# Patient Record
Sex: Female | Born: 1955 | Race: Black or African American | Hispanic: No | Marital: Married | Smoking: Never smoker
Health system: Southern US, Community
[De-identification: ages and names within clinical notes are randomized; demographics above are authoritative.]

## PROBLEM LIST (undated history)

## (undated) DIAGNOSIS — I10 Essential (primary) hypertension: Secondary | ICD-10-CM

## (undated) DIAGNOSIS — M069 Rheumatoid arthritis, unspecified: Secondary | ICD-10-CM

## (undated) HISTORY — PX: ABDOMINAL HYSTERECTOMY: SHX81

---

## 2006-08-24 ENCOUNTER — Emergency Department (HOSPITAL_COMMUNITY): Admission: EM | Admit: 2006-08-24 | Discharge: 2006-08-24 | Payer: Self-pay | Admitting: Emergency Medicine

## 2011-01-18 ENCOUNTER — Telehealth: Payer: Self-pay | Admitting: Family Medicine

## 2011-01-18 NOTE — Telephone Encounter (Signed)
Closed

## 2015-03-12 ENCOUNTER — Emergency Department (HOSPITAL_COMMUNITY)
Admission: EM | Admit: 2015-03-12 | Discharge: 2015-03-12 | Disposition: A | Discharge status: Home / Self Care | Payer: BLUE CROSS/BLUE SHIELD | Attending: Emergency Medicine | Admitting: Emergency Medicine

## 2015-03-12 ENCOUNTER — Emergency Department (HOSPITAL_COMMUNITY): Payer: BLUE CROSS/BLUE SHIELD

## 2015-03-12 ENCOUNTER — Encounter (HOSPITAL_COMMUNITY): Payer: Self-pay

## 2015-03-12 DIAGNOSIS — I10 Essential (primary) hypertension: Secondary | ICD-10-CM | POA: Insufficient documentation

## 2015-03-12 DIAGNOSIS — M1990 Unspecified osteoarthritis, unspecified site: Secondary | ICD-10-CM | POA: Insufficient documentation

## 2015-03-12 DIAGNOSIS — R42 Dizziness and giddiness: Secondary | ICD-10-CM | POA: Diagnosis not present

## 2015-03-12 DIAGNOSIS — R110 Nausea: Secondary | ICD-10-CM | POA: Insufficient documentation

## 2015-03-12 DIAGNOSIS — M25552 Pain in left hip: Secondary | ICD-10-CM | POA: Insufficient documentation

## 2015-03-12 DIAGNOSIS — Z79899 Other long term (current) drug therapy: Secondary | ICD-10-CM | POA: Insufficient documentation

## 2015-03-12 DIAGNOSIS — R109 Unspecified abdominal pain: Secondary | ICD-10-CM | POA: Insufficient documentation

## 2015-03-12 HISTORY — DX: Essential (primary) hypertension: I10

## 2015-03-12 LAB — URINALYSIS, ROUTINE W REFLEX MICROSCOPIC
Bilirubin Urine: NEGATIVE
GLUCOSE, UA: NEGATIVE mg/dL
KETONES UR: NEGATIVE mg/dL
LEUKOCYTES UA: NEGATIVE
Nitrite: NEGATIVE
PROTEIN: NEGATIVE mg/dL
Specific Gravity, Urine: 1.025 (ref 1.005–1.030)
pH: 6 (ref 5.0–8.0)

## 2015-03-12 LAB — URINE MICROSCOPIC-ADD ON: WBC, UA: NONE SEEN WBC/hpf (ref 0–5)

## 2015-03-12 MED ORDER — ETODOLAC ER 400 MG PO TB24: 400.0000 mg | ORAL_TABLET | Freq: Every day | ORAL | Status: DC

## 2015-03-12 MED ORDER — DIAZEPAM 5 MG PO TABS: 5.0000 mg | ORAL_TABLET | Freq: Three times a day (TID) | ORAL | Status: DC | PRN

## 2015-03-12 MED ORDER — DIAZEPAM 5 MG PO TABS
5.0000 mg | ORAL_TABLET | Freq: Once | ORAL | Status: AC
  Administered 2015-03-12: 5 mg via ORAL
  Filled 2015-03-12: qty 1

## 2015-03-12 MED ORDER — OXYCODONE-ACETAMINOPHEN 5-325 MG PO TABS: 1.0000 | ORAL_TABLET | Freq: Four times a day (QID) | ORAL | Status: DC | PRN

## 2015-03-12 MED ORDER — KETOROLAC TROMETHAMINE 60 MG/2ML IM SOLN
30.0000 mg | Freq: Once | INTRAMUSCULAR | Status: AC
  Administered 2015-03-12: 30 mg via INTRAMUSCULAR
  Filled 2015-03-12: qty 2

## 2015-03-12 NOTE — ED Provider Notes (Signed)
CSN: [id number]     Arrival date & time 03/12/15  2032 History  By signing my name below, I, Gonzella Lex, attest that this documentation has been prepared under the direction and in the presence of Marily Memos, MD. Electronically Signed: Gonzella Lex, Scribe. 03/12/2015. 9:15 PM.    Chief Complaint  Patient presents with  . Flank Pain  . Hip Pain     The history is provided by the patient. No language interpreter was used.    HPI Comments: Mary Delacruz is a 59 y.o. female with a hx of HTN and otsteoarthritis who presents to the Emergency Department complaining of gradually worsening left hip pain that radiates down her left side onset yesterday. Pt describes the pain as a tightening and grabbing pain that is worse with movement. She also notes nausea and dizziness. Pt reports that she has never experienced similar hip pain before. She reports that nothing makes it Delacruz. She denies numbness and weakness in her legs, bowel incontinence, recent fever, vomiting, diarrhea, rash and constipation. She also denies any recent falls and reports that she has never been diagnosed with sciatica. Pt is currently on prednisone but denies frequent use of it.   Past Medical History  Diagnosis Date  . Hypertension    Past Surgical History  Procedure Laterality Date  . Abdominal hysterectomy     No family history on file. Social History  Substance Use Topics  . Smoking status: Never Smoker   . Smokeless tobacco: None  . Alcohol Use: No   OB History    No data available     Review of Systems  Constitutional: Negative for fever.  Gastrointestinal: Positive for nausea. Negative for vomiting, diarrhea and constipation.  Genitourinary: Positive for flank pain.  Musculoskeletal: Positive for myalgias and arthralgias.  Skin: Negative for rash.  Neurological: Positive for dizziness. Negative for weakness and numbness.  All other systems reviewed and are  negative.   Allergies  Sulfa antibiotics  Home Medications   Prior to Admission medications   Medication Sig Start Date End Date Taking? Authorizing Provider  amLODipine (NORVASC) 5 MG tablet Take 5 mg by mouth daily. 02/23/15  Yes Historical Provider, MD  cholecalciferol (VITAMIN D) 1000 UNITS tablet Take 1,000 Units by mouth daily.   Yes Historical Provider, MD  Omega-3 Fatty Acids (FISH OIL) 1200 MG CAPS Take 1 capsule by mouth daily.   Yes Historical Provider, MD  predniSONE (STERAPRED UNI-PAK 48 TAB) 10 MG (48) TBPK tablet See admin instructions. Take as directed per package instructions starting on 03/02/2015 for 12 days 03/02/15  Yes Historical Provider, MD  vitamin C (ASCORBIC ACID) 500 MG tablet Take 500 mg by mouth daily.   Yes Historical Provider, MD  diazepam (VALIUM) 5 MG tablet Take 1 tablet (5 mg total) by mouth every 8 (eight) hours as needed for muscle spasms. 03/12/15   Marily Memos, MD  etodolac (LODINE XL) 400 MG 24 hr tablet Take 1 tablet (400 mg total) by mouth daily. 03/12/15   Marily Memos, MD  oxyCODONE-acetaminophen (PERCOCET/ROXICET) 5-325 MG tablet Take 1 tablet by mouth every 6 (six) hours as needed for severe pain. 03/12/15   Marily Memos, MD   BP 110/64 mmHg  Pulse 88  Temp(Src) 98.1 F (36.7 C) (Oral)  Resp 14  Ht  (1.651 m)  Wt 194 lb (87.998 kg)  BMI 32.28 kg/m2  SpO2 100% Physical Exam  Constitutional: She is oriented to person, place, and time. She  appears well-developed and well-nourished. No distress.  HENT:  Head: Normocephalic.  Eyes: Conjunctivae are normal.  Neck:  No cva tenderness No midline back tenderness   Cardiovascular: Normal rate, regular rhythm and normal heart sounds.   Pulmonary/Chest: Effort normal and breath sounds normal. No respiratory distress. She has no wheezes. She has no rales.  Abdominal: Soft. Bowel sounds are normal. She exhibits no distension. There is no tenderness.  Musculoskeletal:  Tenderness over her  greater trochanter of left hip No hip pain with ROM No rashes there  Normal sensation Normal strength in lower extremities  Neurological: She is alert and oriented to person, place, and time.  Skin: Skin is warm and dry.  Psychiatric: She has a normal mood and affect.  Nursing note and vitals reviewed.   ED Course  Procedures  DIAGNOSTIC STUDIES:    Oxygen Saturation is 96% on RA, adequate by my interpretation.   COORDINATION OF CARE:  9:07 PM Will order x-ray. Will prescribe pt pain medication and muscle relaxer. Discussed treatment plan with pt at bedside and pt agreed to plan.    Imaging Review Dg Hip Unilat With Pelvis 2-3 Views Left  03/12/2015  CLINICAL DATA:  Acute onset of left hip pain.  Initial encounter. EXAM: DG HIP (WITH OR WITHOUT PELVIS) 2-3V LEFT COMPARISON:  None. FINDINGS: There is no evidence of fracture or dislocation. Both femoral heads are seated normally within their respective acetabula. The proximal left femur appears intact. Mild degenerative change is noted at the lower lumbar spine, and mild sclerotic change is seen at the sacroiliac joints. The visualized bowel gas pattern is grossly unremarkable in appearance. Bilateral tubal ligation clips are noted. IMPRESSION: No evidence of fracture or dislocation. Electronically Signed   By: Roanna RaiderJeffery  Chang M.D.   On: 03/12/2015 22:38   I have personally reviewed and evaluated these images as part of my medical decision-making.   MDM   Final diagnoses:  Hip pain, left    Likely MSK pain, less likely bursitis. On steroids, elderly so xr done to eval for fracture, AVN, neoplasm but was negative. Symptoms somewhat improved with meds in ED. Will dc on muscle relaxers and anti inflammatories and PCP FU in 2-3 days if not improving. No swelling/erythema, calf ttp to suggest DVT. No joint pain with ROM to suggest septic arthritis.  I personally performed the services described in this documentation, which was scribed  in my presence. The recorded information has been reviewed and is accurate.       Marily MemosJason Sylvester Salonga, MD 03/13/15 1346

## 2015-03-12 NOTE — ED Notes (Signed)
Left side pain and pain in left hip. Started last night and got worse today per pt. Denies pain radiating any where else.  Increases with movement. Denies injury or recent falls. I had some dizziness, sweating, and nausea today.

## 2015-11-23 ENCOUNTER — Encounter (HOSPITAL_COMMUNITY): Payer: Self-pay

## 2015-11-23 DIAGNOSIS — M25511 Pain in right shoulder: Secondary | ICD-10-CM | POA: Diagnosis present

## 2015-11-23 DIAGNOSIS — I10 Essential (primary) hypertension: Secondary | ICD-10-CM | POA: Diagnosis not present

## 2015-11-23 DIAGNOSIS — Z79899 Other long term (current) drug therapy: Secondary | ICD-10-CM | POA: Insufficient documentation

## 2015-11-23 NOTE — ED Triage Notes (Signed)
Pt reports pain to her right shoulder that started tonight, states is very painful with movement and is unable to raise her arm without extreme pain

## 2015-11-24 ENCOUNTER — Emergency Department (HOSPITAL_COMMUNITY)
Admission: EM | Admit: 2015-11-24 | Discharge: 2015-11-24 | Disposition: A | Discharge status: Home / Self Care | Payer: BLUE CROSS/BLUE SHIELD | Attending: Emergency Medicine | Admitting: Emergency Medicine

## 2015-11-24 ENCOUNTER — Emergency Department (HOSPITAL_COMMUNITY): Payer: BLUE CROSS/BLUE SHIELD

## 2015-11-24 DIAGNOSIS — M25511 Pain in right shoulder: Secondary | ICD-10-CM

## 2015-11-24 MED ORDER — IBUPROFEN 800 MG PO TABS: 800.0000 mg | ORAL_TABLET | Freq: Three times a day (TID) | ORAL | 0 refills | Status: DC | PRN

## 2015-11-24 MED ORDER — OXYCODONE-ACETAMINOPHEN 5-325 MG PO TABS
2.0000 | ORAL_TABLET | Freq: Once | ORAL | Status: AC
Start: 2015-11-24 — End: 2015-11-24
  Administered 2015-11-24: 2 via ORAL
  Filled 2015-11-24: qty 2

## 2015-11-24 MED ORDER — OXYCODONE-ACETAMINOPHEN 5-325 MG PO TABS: 1.0000 | ORAL_TABLET | Freq: Four times a day (QID) | ORAL | 0 refills | Status: DC | PRN

## 2015-11-24 MED ORDER — ONDANSETRON 4 MG PO TBDP
4.0000 mg | ORAL_TABLET | Freq: Once | ORAL | Status: AC
  Administered 2015-11-24: 4 mg via ORAL
  Filled 2015-11-24: qty 1

## 2015-11-24 MED ORDER — IBUPROFEN 800 MG PO TABS
800.0000 mg | ORAL_TABLET | Freq: Once | ORAL | Status: AC
  Administered 2015-11-24: 800 mg via ORAL
  Filled 2015-11-24: qty 1

## 2015-11-24 NOTE — ED Provider Notes (Signed)
TIME SEEN: 2:20 AM  CHIEF COMPLAINT: Right shoulder pain  HPI: Pt is a 60 y.o. right-hand-dominant female with history of hypertension who presents emergency department right shoulder pain that started yesterday. No known injury to the arm. Pain is worse with trying to lift her arm above her head. No history of problems with her shoulder in the past. No fevers. No chest pain or shortness of breath. No numbness or focal weakness. No neck or back pain. 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 Does not regularly lift heavy things.  ROS: See HPI Constitutional: no fever  Eyes: no drainage  ENT: no runny nose   Cardiovascular:  no chest pain  Resp: no SOB  GI: no vomiting GU: no dysuria Integumentary: no rash  Allergy: no hives  Musculoskeletal: no leg swelling  Neurological: no slurred speech ROS otherwise negative  PAST MEDICAL HISTORY/PAST SURGICAL HISTORY:  Past Medical History:  Diagnosis Date  . Hypertension     MEDICATIONS:  Prior to Admission medications   Medication Sig Start Date End Date Taking? Authorizing Provider  amLODipine (NORVASC) 5 MG tablet Take 5 mg by mouth daily. 02/23/15  Yes Historical Provider, MD  cholecalciferol (VITAMIN D) 1000 UNITS tablet Take 1,000 Units by mouth daily.   Yes Historical Provider, MD  Omega-3 Fatty Acids (FISH OIL) 1200 MG CAPS Take 1 capsule by mouth daily.   Yes Historical Provider, MD  diazepam (VALIUM) 5 MG tablet Take 1 tablet (5 mg total) by mouth every 8 (eight) hours as needed for muscle spasms. 03/12/15   Marily Memos, MD  etodolac (LODINE XL) 400 MG 24 hr tablet Take 1 tablet (400 mg total) by mouth daily. 03/12/15   Marily Memos, MD  oxyCODONE-acetaminophen (PERCOCET/ROXICET) 5-325 MG tablet Take 1 tablet by mouth every 6 (six) hours as needed for severe pain. 03/12/15   Marily Memos, MD  predniSONE (STERAPRED UNI-PAK 48 TAB) 10 MG (48) TBPK tablet See admin instructions. Take as directed per package instructions starting on 03/02/2015  for 12 days 03/02/15   Historical Provider, MD  vitamin C (ASCORBIC ACID) 500 MG tablet Take 500 mg by mouth daily.    Historical Provider, MD    ALLERGIES:  Allergies  Allergen Reactions  . Sulfa Antibiotics Hives and Swelling    SOCIAL HISTORY:  Social History  Substance Use Topics  . Smoking status: Never Smoker  . Smokeless tobacco: Never Used  . Alcohol use No    FAMILY HISTORY: No family history on file.  EXAM: BP 142/79 (BP Location: Left Arm)   Pulse 71   Temp 98.9 F (37.2 C) (Oral)   Resp 18   Ht 5\' 5"  (1.651 m)   Wt 192 lb (87.1 kg)   SpO2 100%   BMI 31.95 kg/m  CONSTITUTIONAL: Alert and oriented and responds appropriately to questions. Well-appearing; well-nourished HEAD: Normocephalic EYES: Conjunctivae clear, PERRL ENT: normal nose; no rhinorrhea; moist mucous membranes NECK: Supple, no meningismus, no LAD  CARD: RRR; S1 and S2 appreciated; no murmurs, no clicks, no rubs, no gallops RESP: Normal chest excursion without splinting or tachypnea; breath sounds clear and equal bilaterally; no wheezes, no rhonchi, no rales, no hypoxia or respiratory distress, speaking full sentences ABD/GI: Normal bowel sounds; non-distended; soft, non-tender, no rebound, no guarding, no peritoneal signs BACK:  The back appears normal and is non-tender to palpation, there is no CVA tenderness EXT: Tender to palpation over the right shoulder diffusely without loss of fullness or bony deformity. There is no erythema  or warmth at this joint. No swelling or tenderness over the bursa. Full range of motion in the right elbow, wrist, fingers with no tenderness over these areas. Decreased ability to flex her shoulder on the right side past 30 secondary to pain. Unable to test muscle groups of the rotator cuff due to patient's pain. 2+ radial pulses bilaterally. Sensation to light touch intact diffusely. Normal grip strength bilaterally. Otherwise Normal ROM in all joints; otherwise extremity  is are non-tender to palpation; no edema; normal capillary refill; no cyanosis, no calf tenderness or swelling    SKIN: Normal color for age and race; warm; no rash NEURO: Moves all extremities equally, sensation to light touch intact diffusely, cranial nerves II through XII intact PSYCH: The patient's mood and manner are appropriate. Grooming and personal hygiene are appropriate.  MEDICAL DECISION MAKING: Patient here with right shoulder pain. Has degenerative changes on x-ray but no fracture or dislocation. Nothing on exam to suggest gout or septic arthritis. No chest pain or shortness of breath. Doubt this is her anginal equivalent. Pain reproduced with palpation and movement of the arm. Suspect tendinitis, possible rotator cuff injury. Have her committed elevation, ice, anti-inflammatories. We'll place her in a sling for comfort but have advised her to take her arm out of her sling regularly to avoid developing adhesive capsulitis. Discussed with her that if medical management does not improve her symptoms she may need follow-up with orthopedic physician for possible further imaging such as an MRI, steroid injections in the joint. Discussed return precautions. She is comfortable with this plan. We'll provide work note.  At this time, I do not feel there is any life-threatening condition present. I have reviewed and discussed all results (EKG, imaging, lab, urine as appropriate), exam findings with patient/family. I have reviewed nursing notes and appropriate previous records.  I feel the patient is safe to be discharged home without further emergent workup and can continue workup as an outpatient. Discussed usual and customary return precautions. Patient/family verbalize understanding and are comfortable with this plan.  Outpatient follow-up has been provided. All questions have been answered.        Layla MawKristen N Ward, DO 11/24/15 [id number]

## 2015-11-24 NOTE — Discharge Instructions (Signed)
You may use her sling for comfort. Please take your arm out of the sling regularly and move your shoulder to avoid developing adhesive capsulitis also known as frozen shoulder. If your pain does not improve with rest, elevation, ice, anti-inflammatories, please follow-up with orthopedics for further evaluation. Your x-ray showed no fracture or dislocation but did show arthritic changes, degenerative changes.

## 2018-12-01 ENCOUNTER — Encounter: Payer: Self-pay | Admitting: *Deleted

## 2019-04-20 ENCOUNTER — Telehealth: Payer: Self-pay | Admitting: Family Medicine

## 2019-04-20 NOTE — Telephone Encounter (Signed)
error 

## 2020-08-21 ENCOUNTER — Emergency Department (HOSPITAL_COMMUNITY): Payer: BC Managed Care – PPO

## 2020-08-21 ENCOUNTER — Emergency Department (HOSPITAL_COMMUNITY)
Admission: EM | Admit: 2020-08-21 | Discharge: 2020-08-21 | Disposition: A | Discharge status: Home / Self Care | Payer: BC Managed Care – PPO | Attending: Physician Assistant | Admitting: Physician Assistant

## 2020-08-21 ENCOUNTER — Other Ambulatory Visit: Payer: Self-pay

## 2020-08-21 ENCOUNTER — Encounter (HOSPITAL_COMMUNITY): Payer: Self-pay | Admitting: Emergency Medicine

## 2020-08-21 DIAGNOSIS — I10 Essential (primary) hypertension: Secondary | ICD-10-CM | POA: Insufficient documentation

## 2020-08-21 DIAGNOSIS — R079 Chest pain, unspecified: Secondary | ICD-10-CM

## 2020-08-21 HISTORY — DX: Rheumatoid arthritis, unspecified: M06.9

## 2020-08-21 LAB — COMPREHENSIVE METABOLIC PANEL
ALT: 21 U/L (ref 0–44)
AST: 18 U/L (ref 15–41)
Albumin: 4.2 g/dL (ref 3.5–5.0)
Alkaline Phosphatase: 59 U/L (ref 38–126)
Anion gap: 7 (ref 5–15)
BUN: 19 mg/dL (ref 8–23)
CO2: 27 mmol/L (ref 22–32)
Calcium: 9.2 mg/dL (ref 8.9–10.3)
Chloride: 107 mmol/L (ref 98–111)
Creatinine, Ser: 0.79 mg/dL (ref 0.44–1.00)
GFR, Estimated: >60 mL/min (ref >60)
Glucose, Bld: 103 mg/dL — ABNORMAL HIGH (ref 70–99)
Potassium: 3.9 mmol/L (ref 3.5–5.1)
Sodium: 141 mmol/L (ref 135–145)
Total Bilirubin: 0.8 mg/dL (ref 0.3–1.2)
Total Protein: 7.1 g/dL (ref 6.5–8.1)

## 2020-08-21 LAB — TROPONIN I (HIGH SENSITIVITY)
Troponin I (High Sensitivity): 3 ng/L (ref <18)
Troponin I (High Sensitivity): 2 ng/L (ref <18)

## 2020-08-21 LAB — CBC
HCT: 40.3 % (ref 36.0–46.0)
Hemoglobin: 12.8 g/dL (ref 12.0–15.0)
MCH: 30.2 pg (ref 26.0–34.0)
MCHC: 31.8 g/dL (ref 30.0–36.0)
MCV: 95 fL (ref 80.0–100.0)
Platelets: 242 10*3/uL (ref 150–400)
RBC: 4.24 MIL/uL (ref 3.87–5.11)
RDW: 13.7 % (ref 11.5–15.5)
WBC: 3.3 10*3/uL — ABNORMAL LOW (ref 4.0–10.5)
nRBC: 0 % (ref 0.0–0.2)

## 2020-08-21 MED ORDER — ASPIRIN 81 MG PO CHEW
324.0000 mg | CHEWABLE_TABLET | Freq: Once | ORAL | Status: AC
  Administered 2020-08-21: 324 mg via ORAL
  Filled 2020-08-21: qty 4

## 2020-08-21 NOTE — ED Provider Notes (Signed)
[facility] EMERGENCY DEPARTMENT Provider Note   CSN: [id number] Arrival date & time: 08/21/20  4008     History Chief Complaint  Patient presents with  . Chest Pain    Mary Delacruz is a 65 y.o. female.  The history is provided by the patient. No language interpreter was used.  Chest Pain Pain location:  R chest Pain quality: aching   Pain radiates to:  Does not radiate Pain severity:  Moderate Onset quality:  Gradual Duration:  2 hours Timing:  Constant Progression:  Worsening Chronicity:  New Relieved by:  Nothing Worsened by:  Nothing Ineffective treatments:  None tried Associated symptoms: no back pain, no shortness of breath and no vomiting   Pt reports she had pain in the right side of her chest.  Pt reports pain radiated to her right abdomen.  Pt reports pain is resolved.     Past Medical History:  Diagnosis Date  . Hypertension   . Rheumatoid arthritis (HCC)     There are no problems to display for this patient.   Past Surgical History:  Procedure Laterality Date  . ABDOMINAL HYSTERECTOMY       OB History   No obstetric history on file.     History reviewed. No pertinent family history.  Social History   Tobacco Use  . Smoking status: Never Smoker  . Smokeless tobacco: Never Used  Vaping Use  . Vaping Use: Never used  Substance Use Topics  . Alcohol use: No  . Drug use: No    Home Medications Prior to Admission medications   Medication Sig Start Date End Date Taking? Authorizing Provider  cholecalciferol (VITAMIN D) 1000 UNITS tablet Take 1,000 Units by mouth daily.    [provider]  doxycycline (ADOXA) 50 MG tablet Take 50 mg by mouth every morning. 08/17/20   [provider]  folic acid (FOLVITE) 1 MG tablet Take 1 mg by mouth daily. 08/12/20   [provider]  methotrexate (RHEUMATREX) 2.5 MG tablet Take 15 mg by mouth once a week. 08/12/20   [provider]  Omega-3 Fatty Acids (FISH OIL) 1200  MG CAPS Take 1 capsule by mouth daily.    [provider]  prednisoLONE acetate (PRED FORTE) 1 % ophthalmic suspension SMARTSIG:In Eye(s) 04/23/20   [provider]  spironolactone (ALDACTONE) 100 MG tablet Take 100 mg by mouth daily. 08/15/20   [provider]  tobramycin-dexamethasone Wallene Dales) ophthalmic solution SMARTSIG:In Eye(s) 07/20/20   [provider]  vitamin C (ASCORBIC ACID) 500 MG tablet Take 500 mg by mouth daily.    [provider]    Allergies    Sulfa antibiotics  Review of Systems   Review of Systems  Respiratory: Negative for shortness of breath.   Cardiovascular: Positive for chest pain.  Gastrointestinal: Negative for vomiting.  Genitourinary: Negative for difficulty urinating.  Musculoskeletal: Negative for back pain.  Skin: Negative for color change.  Neurological: Negative for light-headedness.  Psychiatric/Behavioral: Negative for behavioral problems.  All other systems reviewed and are negative.   Physical Exam Updated Vital Signs BP 112/74   Pulse 80   Temp 98 F (36.7 C) (Oral)   Resp 17   Ht 5\' 5"  (1.651 m)   Wt 88.5 kg   SpO2 97%   BMI 32.45 kg/m   Physical Exam Vitals and nursing note reviewed.  Constitutional:      Appearance: She is well-developed.  HENT:     Head: Normocephalic.  Cardiovascular:  Rate and Rhythm: Normal rate and regular rhythm.     Heart sounds: Normal heart sounds. Heart sounds not distant.  Pulmonary:     Effort: Pulmonary effort is normal.  Abdominal:     General: Bowel sounds are normal. There is no distension.     Palpations: Abdomen is soft.  Musculoskeletal:        General: Normal range of motion.     Cervical back: Normal range of motion.  Neurological:     Mental Status: She is alert and oriented to person, place, and time.  Psychiatric:        Mood and Affect: Mood normal.     ED Results / Procedures / Treatments   Labs (all labs ordered are listed,  but only abnormal results are displayed) Labs Reviewed  CBC - Abnormal; Notable for the following components:      Result Value   WBC 3.3 (*)    All other components within normal limits  COMPREHENSIVE METABOLIC PANEL - Abnormal; Notable for the following components:   Glucose, Bld 103 (*)    All other components within normal limits  CBG MONITORING, ED  TROPONIN I (HIGH SENSITIVITY)  TROPONIN I (HIGH SENSITIVITY)  TROPONIN I (HIGH SENSITIVITY)    EKG EKG Interpretation  Date/Time:  Sunday Aug 21 2020 09:16:33 EDT Ventricular Rate:  75 PR Interval:  181 QRS Duration: 91 QT Interval:  400 QTC Calculation: 447 R Axis:   69 Text Interpretation: Sinus rhythm Borderline T abnormalities, anterior leads No old tracing for comparison Confirmed by Alona Bene [phone]) on 08/21/2020 9:21:25 AM   Radiology DG Chest Portable 1 View  Result Date: 08/21/2020 CLINICAL DATA:  Right-sided chest pain beginning this morning. EXAM: PORTABLE CHEST 1 VIEW COMPARISON:  08/24/2006. FINDINGS: Cardiac silhouette is normal in size and configuration. Normal mediastinal and hilar contours. Clear lungs.  No pleural effusion or pneumothorax. Skeletal structures are grossly intact. IMPRESSION: No active disease. Electronically Signed   By: Amie Portland M.D.   On: 08/21/2020 11:00    Procedures Procedures   Medications Ordered in ED Medications  aspirin chewable tablet 324 mg (324 mg Oral Given 08/21/20 1100)    ED Course  I have reviewed the triage vital signs and the nursing notes.  Pertinent labs & imaging results that were available during my care of the patient were reviewed by me and considered in my medical decision making (see chart for details).    MDM Rules/Calculators/A&P                          MDM:  Ekg no acute abnormality  Troponin is negative x 2  Pt is pain free.  Pt advised to take a baby asa daily.  Follow up with primary care for recheck in 2-3 days  Final Clinical  Impression(s) / ED Diagnoses Final diagnoses:  Nonspecific chest pain    Rx / DC Orders ED Discharge Orders    None    An After Visit Summary was printed and given to the patient.    Elson Areas, [place] 08/21/20 1414    Maia Plan, MD 08/25/20 [phone]

## 2020-08-21 NOTE — Discharge Instructions (Signed)
See your Physician for recheck in 2-3 days.  Return if symptoms worsen or cahnge.

## 2020-08-21 NOTE — ED Triage Notes (Signed)
Patient to the ED with complaints of non-radiating rt side chest pain that began at 0730 this morning.  Pt states the pain feels like heaviness.

## 2021-08-17 ENCOUNTER — Telehealth: Payer: Self-pay

## 2021-12-20 IMAGING — DX DG CHEST 1V PORT
1 series · 1 of 1 positions shown · non-contrast
Comparison: 08/24/2006.

CLINICAL DATA: Right-sided chest pain beginning this morning.

EXAM:
PORTABLE CHEST 1 VIEW

[chest ap]
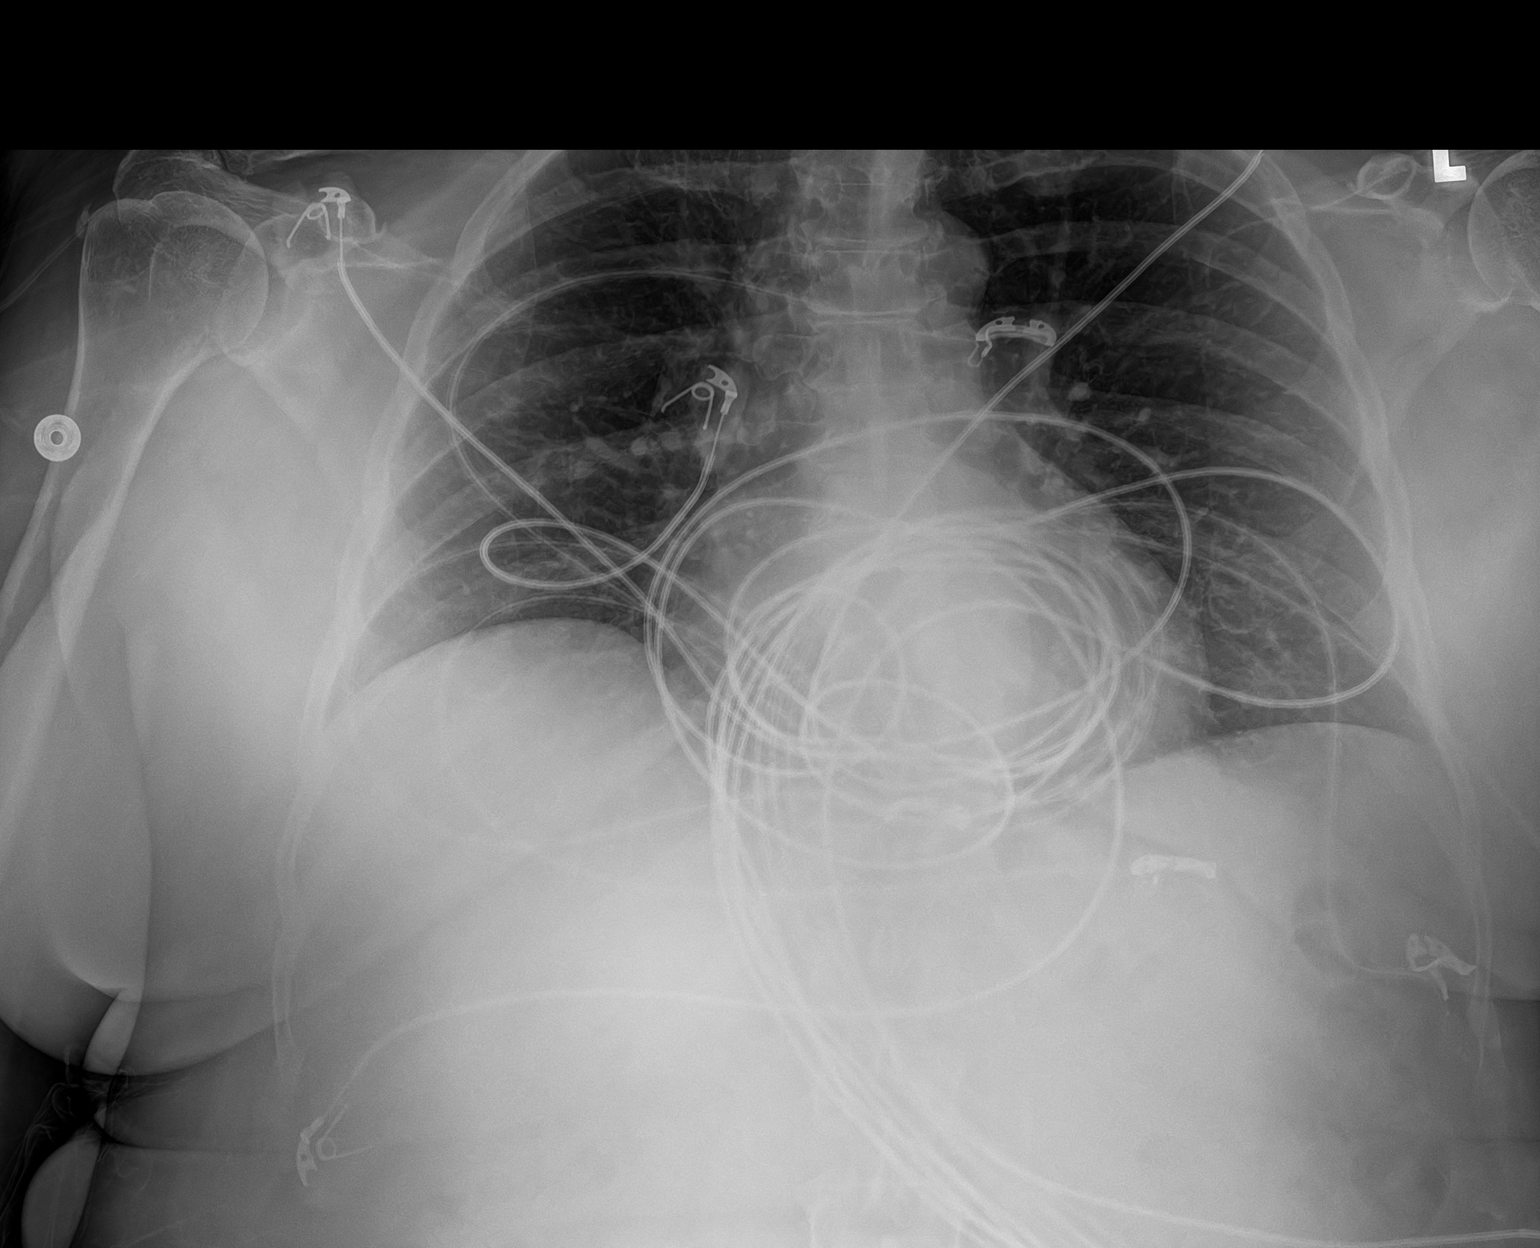

[1 of 1 positions shown; findings below may reference images not displayed]

FINDINGS: Cardiac silhouette is normal in size and configuration. Normal
mediastinal and hilar contours.

Clear lungs.  No pleural effusion or pneumothorax.

Skeletal structures are grossly intact.
IMPRESSION: No active disease.

## 2022-01-11 NOTE — Telephone Encounter (Signed)
error 

## 2022-09-01 ENCOUNTER — Emergency Department (HOSPITAL_COMMUNITY): Payer: Medicare Other

## 2022-09-01 ENCOUNTER — Other Ambulatory Visit: Payer: Self-pay

## 2022-09-01 ENCOUNTER — Emergency Department (HOSPITAL_COMMUNITY)
Admission: EM | Admit: 2022-09-01 | Discharge: 2022-09-01 | Disposition: A | Discharge status: Home / Self Care | Payer: Medicare Other | Attending: Student | Admitting: Student

## 2022-09-01 DIAGNOSIS — R062 Wheezing: Secondary | ICD-10-CM | POA: Diagnosis not present

## 2022-09-01 DIAGNOSIS — R059 Cough, unspecified: Secondary | ICD-10-CM | POA: Diagnosis present

## 2022-09-01 DIAGNOSIS — Z20822 Contact with and (suspected) exposure to covid-19: Secondary | ICD-10-CM | POA: Diagnosis not present

## 2022-09-01 DIAGNOSIS — I10 Essential (primary) hypertension: Secondary | ICD-10-CM | POA: Insufficient documentation

## 2022-09-01 DIAGNOSIS — J069 Acute upper respiratory infection, unspecified: Secondary | ICD-10-CM | POA: Diagnosis not present

## 2022-09-01 LAB — SARS CORONAVIRUS 2 BY RT PCR: SARS Coronavirus 2 by RT PCR: NEGATIVE

## 2022-09-01 MED ORDER — PSEUDOEPHEDRINE HCL ER 120 MG PO TB12: 120.0000 mg | ORAL_TABLET | Freq: Two times a day (BID) | ORAL | 0 refills | Status: AC

## 2022-09-01 MED ORDER — PSEUDOEPHEDRINE HCL 60 MG PO TABS
60.0000 mg | ORAL_TABLET | Freq: Once | ORAL | Status: AC
  Administered 2022-09-01: 60 mg via ORAL
  Filled 2022-09-01: qty 1

## 2022-09-01 MED ORDER — BENZONATATE 100 MG PO CAPS
200.0000 mg | ORAL_CAPSULE | Freq: Once | ORAL | Status: AC
  Administered 2022-09-01: 200 mg via ORAL
  Filled 2022-09-01: qty 2

## 2022-09-01 MED ORDER — BENZONATATE 100 MG PO CAPS: 100.0000 mg | ORAL_CAPSULE | Freq: Three times a day (TID) | ORAL | 0 refills | Status: AC

## 2022-09-01 MED ORDER — ALBUTEROL SULFATE HFA 108 (90 BASE) MCG/ACT IN AERS
1.0000 | INHALATION_SPRAY | Freq: Once | RESPIRATORY_TRACT | Status: AC
  Administered 2022-09-01: 1 via RESPIRATORY_TRACT
  Filled 2022-09-01: qty 6.7

## 2022-09-01 NOTE — ED Provider Notes (Signed)
[facility] EMERGENCY DEPARTMENT AT [facility] Provider Note  CSN: [id number] Arrival date & time: 09/01/22 6578  Chief Complaint(s) Cough and Fever  HPI Mary Delacruz is a 67 y.o. female with PMH HTN, rheumatoid arthritis who presents emergency department for evaluation of cough and fever.  Patient states that for the last 2 weeks she has had upper respiratory symptoms including cough and rhinorrhea and recently saw her primary care physician who placed her on a Medrol Dosepak but patient has not had very much improvement.  She states that last night she started to get fever and chills with Tmax 102.0 at home.  She took Tylenol prior to arrival and here in the emergency department is afebrile with complaints of cough but denies chest pain, shortness of breath, abdominal pain, nausea, vomiting or any other systemic symptoms.   Past Medical History Past Medical History:  Diagnosis Date   Hypertension    Rheumatoid arthritis (HCC)    There are no problems to display for this patient.  Home Medication(s) Prior to Admission medications   Medication Sig Start Date End Date Taking? Authorizing Provider  benzonatate (TESSALON) 100 MG capsule Take 1 capsule (100 mg total) by mouth every 8 (eight) hours. 09/01/22  Yes Kekoa Fyock, MD  pseudoephedrine (SUDAFED SINUS CONGESTION 12HR) 120 MG 12 hr tablet Take 1 tablet (120 mg total) by mouth 2 (two) times daily for 7 days. 09/01/22 09/08/22 Yes Khaiden Segreto, MD  cholecalciferol (VITAMIN D) 1000 UNITS tablet Take 1,000 Units by mouth daily.    [provider]  doxycycline (ADOXA) 50 MG tablet Take 50 mg by mouth every morning. 08/17/20   [provider]  folic acid (FOLVITE) 1 MG tablet Take 1 mg by mouth daily. 08/12/20   [provider]  methotrexate (RHEUMATREX) 2.5 MG tablet Take 15 mg by mouth once a week. 08/12/20   [provider]  Omega-3 Fatty Acids (FISH OIL) 1200 MG CAPS Take 1 capsule by mouth  daily.    [provider]  prednisoLONE acetate (PRED FORTE) 1 % ophthalmic suspension SMARTSIG:In Eye(s) 04/23/20   [provider]  spironolactone (ALDACTONE) 100 MG tablet Take 100 mg by mouth daily. 08/15/20   [provider]  tobramycin-dexamethasone Wallene Dales) ophthalmic solution SMARTSIG:In Eye(s) 07/20/20   [provider]  vitamin C (ASCORBIC ACID) 500 MG tablet Take 500 mg by mouth daily.    [provider]                                                                                                                                    Past Surgical History Past Surgical History:  Procedure Laterality Date   ABDOMINAL HYSTERECTOMY     Family History No family history on file.  Social History Social History   Tobacco Use   Smoking status: Never   Smokeless tobacco: Never  Vaping Use   Vaping Use: Never  used  Substance Use Topics   Alcohol use: No   Drug use: No   Allergies Sulfa antibiotics  Review of Systems Review of Systems  HENT:  Positive for rhinorrhea.   Respiratory:  Positive for cough.     Physical Exam Vital Signs  I have reviewed the triage vital signs BP 113/73   Pulse (!) 102   Temp 99.3 F (37.4 C) (Oral)   Resp 18   Ht 5\' 5"  (1.651 m)   Wt 86.2 kg   SpO2 95%   BMI 31.62 kg/m   Physical Exam Vitals and nursing note reviewed.  Constitutional:      General: She is not in acute distress.    Appearance: She is well-developed.  HENT:     Head: Normocephalic and atraumatic.  Eyes:     Conjunctiva/sclera: Conjunctivae normal.  Cardiovascular:     Rate and Rhythm: Normal rate and regular rhythm.     Heart sounds: No murmur heard. Pulmonary:     Effort: Pulmonary effort is normal. No respiratory distress.     Breath sounds: Wheezing present.  Musculoskeletal:        General: No swelling.     Cervical back: Neck supple.  Skin:    General: Skin is warm and dry.     Capillary Refill: Capillary  refill takes less than 2 seconds.  Neurological:     Mental Status: She is alert.  Psychiatric:        Mood and Affect: Mood normal.     ED Results and Treatments Labs (all labs ordered are listed, but only abnormal results are displayed) Labs Reviewed  SARS CORONAVIRUS 2 BY RT PCR                                                                                                                          Radiology DG Chest 2 View  Result Date: 09/01/2022 CLINICAL DATA:  cough EXAM: CHEST - 2 VIEW COMPARISON:  08/21/2020 FINDINGS: Lungs are clear. Heart size and mediastinal contours are within normal limits. No effusion. Visualized bones unremarkable. IMPRESSION: No acute cardiopulmonary disease. Electronically Signed   By: Corlis Leak M.D.   On: 09/01/2022 09:54    Pertinent labs & imaging results that were available during my care of the patient were reviewed by me and considered in my medical decision making (see MDM for details).  Medications Ordered in ED Medications  benzonatate (TESSALON) capsule 200 mg (200 mg Oral Given 09/01/22 1106)  albuterol (VENTOLIN HFA) 108 (90 Base) MCG/ACT inhaler 1 puff (1 puff Inhalation Given 09/01/22 1042)  pseudoephedrine (SUDAFED) tablet 60 mg (60 mg Oral Given 09/01/22 1106)  Procedures Procedures  (including critical care time)  Medical Decision Making / ED Course   This patient presents to the ED for concern of cough, fever, this involves an extensive number of treatment options, and is a complaint that carries with it a high risk of complications and morbidity.  The differential diagnosis includes pneumonia, upper respiratory infection, strep throat, bacteremia, PE, reactive airway disease, COPD, asthma  MDM: Patient seen emergency room for evaluation of cough and fever.  Physical exam with wheezing bilaterally but  is otherwise unremarkable.  Oropharyngeal exam reassuring with no tonsillar swelling or exudate.  COVID test negative.  Chest x-ray reassuringly negative for acute pathology including pneumonia.  Patient is low risk by Wells criteria and as she has no shortness of breath, hypoxia I have very low suspicion for PE.  Patient received an albuterol inhaler and wheezing improved.  She received Tessalon Perles and Sudafed and her cough improved.  I had a discussion with the patient about pursuing laboratory evaluation and after shared decision-making, we will agreed that this would likely not change her management today as her symptoms are consistent with a viral URI and thus we deferred laboratory evaluation.  Patient will be discharged with [facility] and will use her albuterol inhaler as needed.  At this time she does not meet inpatient criteria for admission she is safe for discharge with outpatient follow-up but was given strict return precautions of which she voiced understanding.   Additional history obtained: -Additional history obtained from husband -External records from outside source obtained and reviewed including: Chart review including previous notes, labs, imaging, consultation notes   Lab Tests: -I ordered, reviewed, and interpreted labs.   The pertinent results include:   Labs Reviewed  SARS CORONAVIRUS 2 BY RT PCR         Imaging Studies ordered: I ordered imaging studies including chest x-ray I independently visualized and interpreted imaging. I agree with the radiologist interpretation   Medicines ordered and prescription drug management: Meds ordered this encounter  Medications   benzonatate (TESSALON) capsule 200 mg   albuterol (VENTOLIN HFA) 108 (90 Base) MCG/ACT inhaler 1 puff   pseudoephedrine (SUDAFED) tablet 60 mg   benzonatate (TESSALON) 100 MG capsule    Sig: Take 1 capsule (100 mg total) by mouth every 8 (eight) hours.    Dispense:  21 capsule    Refill:   0   pseudoephedrine (SUDAFED SINUS CONGESTION 12HR) 120 MG 12 hr tablet    Sig: Take 1 tablet (120 mg total) by mouth 2 (two) times daily for 7 days.    Dispense:  14 tablet    Refill:  0    -I have reviewed the patients home medicines and have made adjustments as needed  Critical interventions none    Cardiac Monitoring: The patient was maintained on a cardiac monitor.  I personally viewed and interpreted the cardiac monitored which showed an underlying rhythm of: NSR, sinus tachycardia  Social Determinants of Health:  Factors impacting patients care include: none   Reevaluation: After the interventions noted above, I reevaluated the patient and found that they have :improved  Co morbidities that complicate the patient evaluation  Past Medical History:  Diagnosis Date   Hypertension    Rheumatoid arthritis (HCC)       Dispostion: I considered admission for this patient, but at this time she does not meet inpatient criteria for admission and she is safe for discharge with outpatient follow-up     Final Clinical  Impression(s) / ED Diagnoses Final diagnoses:  Upper respiratory tract infection, unspecified type     @PCDICTATION @    Glendora Score, MD 09/01/22 1851

## 2022-09-01 NOTE — ED Notes (Signed)
O2 while ambulating was 96. Ambulated to the bathroom and back to room.

## 2022-09-01 NOTE — ED Triage Notes (Signed)
Pt c/o cough, fever, chills, nasal drainage and congestion for a few weeks.
